# Patient Record
Sex: Female | Born: 2000
Health system: Southern US, Community
[De-identification: ages and names within clinical notes are randomized; demographics above are authoritative.]

## PROBLEM LIST (undated history)

## (undated) DIAGNOSIS — L709 Acne, unspecified: Secondary | ICD-10-CM

## (undated) DIAGNOSIS — J3089 Other allergic rhinitis: Secondary | ICD-10-CM

## (undated) HISTORY — DX: Acne, unspecified: L70.9

## (undated) HISTORY — DX: Other allergic rhinitis: J30.89

---
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
## 2022-06-27 ENCOUNTER — Ambulatory Visit: Payer: No Typology Code available for payment source | Admitting: Neurology

## 2022-08-20 ENCOUNTER — Ambulatory Visit: Payer: No Typology Code available for payment source | Admitting: Neurology

## 2022-11-15 IMAGING — DX DG RIBS W/ CHEST 3+V*R*
3 series · 3 of 3 positions shown · non-contrast
Comparison: None.

CLINICAL DATA: Lower chest pain after cough and bending over

EXAM:
RIGHT RIBS AND CHEST - 3+ VIEW

[chest pa]
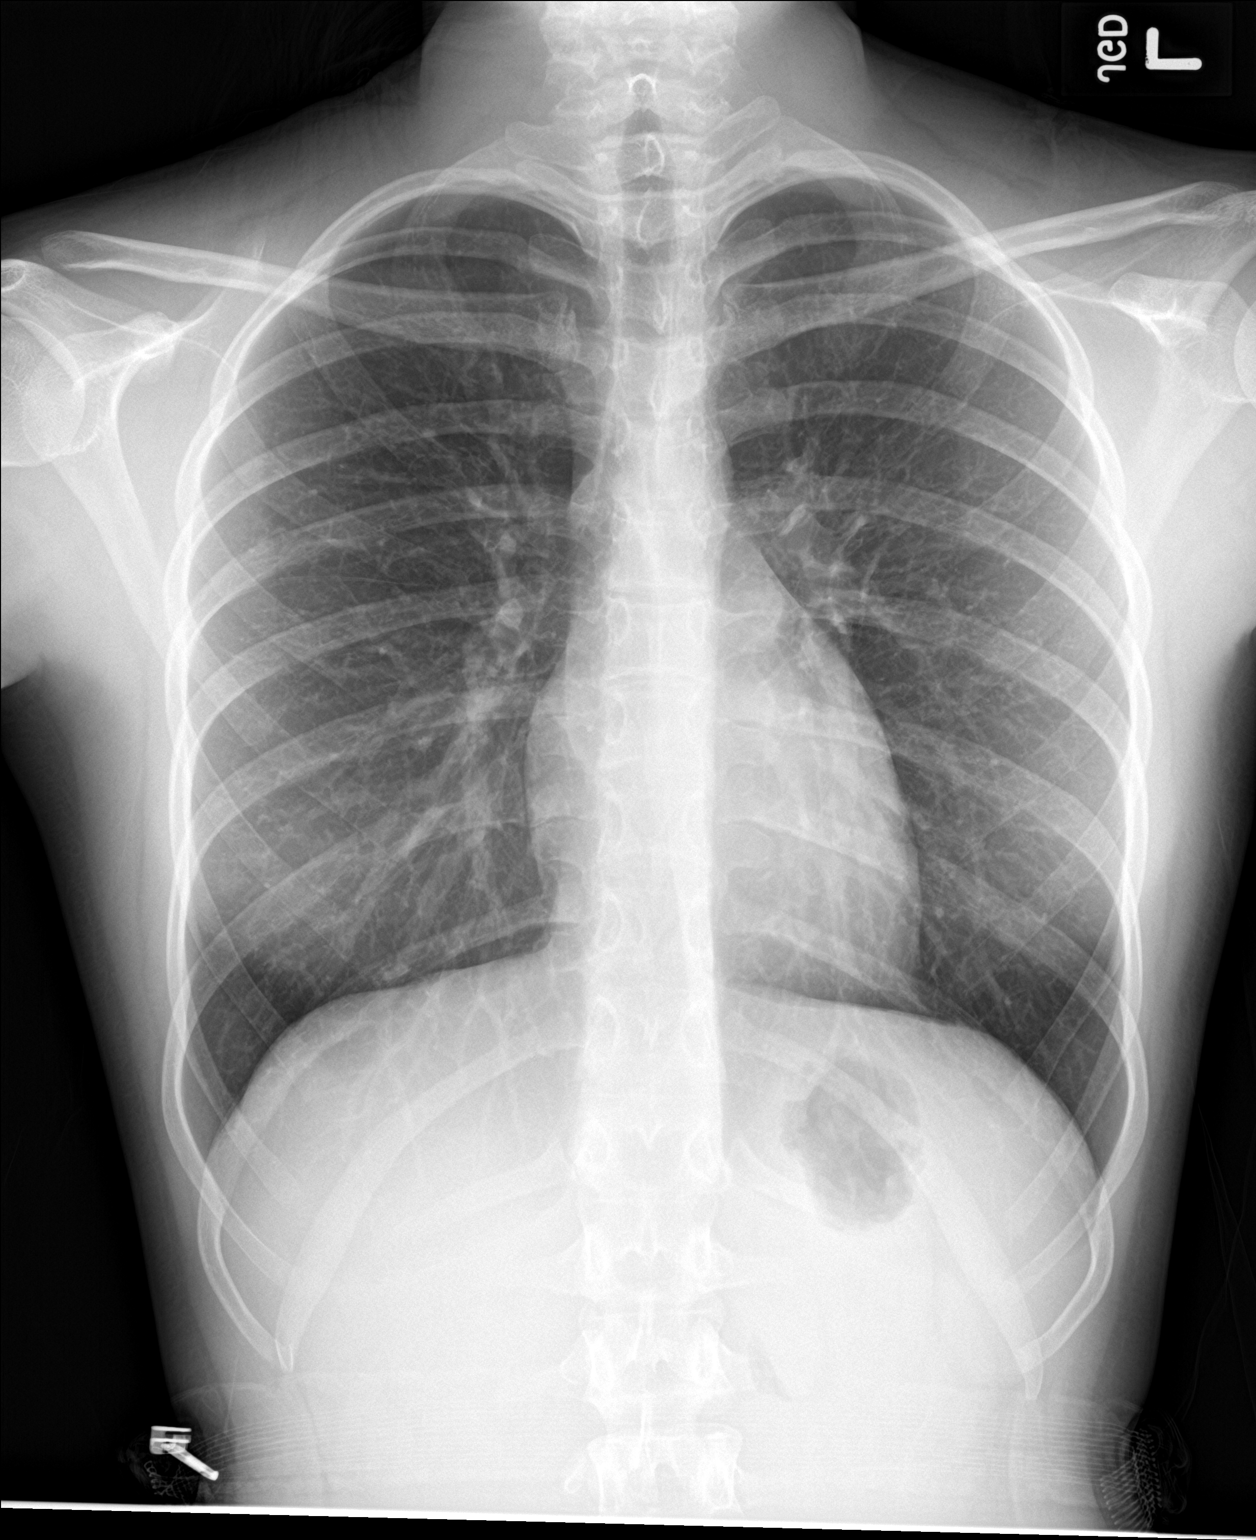

[rib obl]
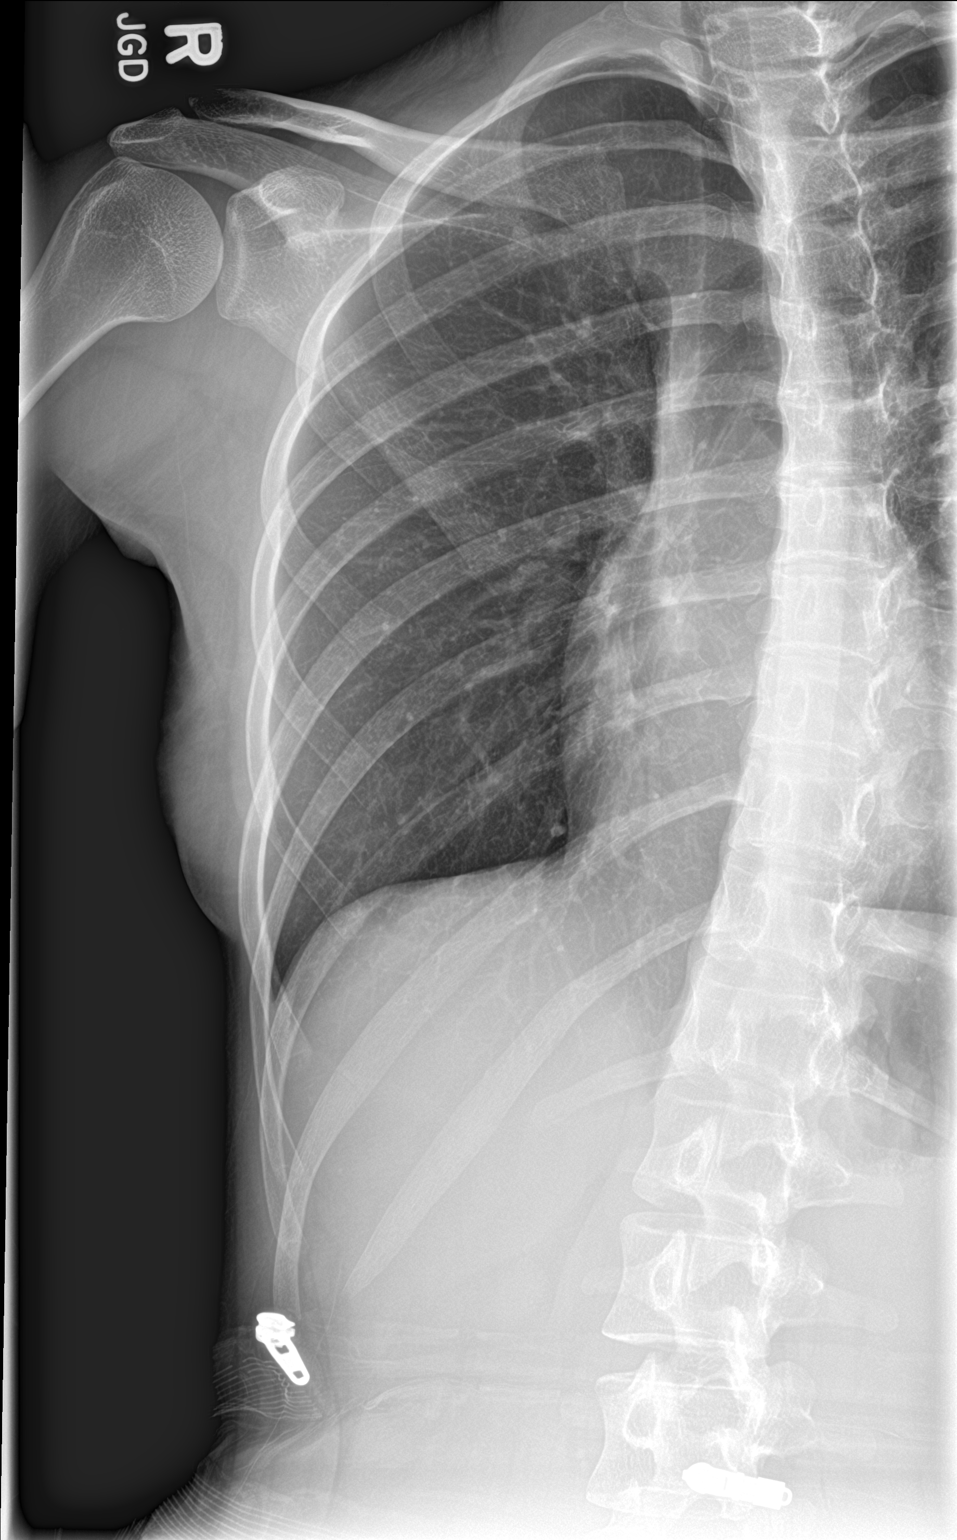

[rib ap]
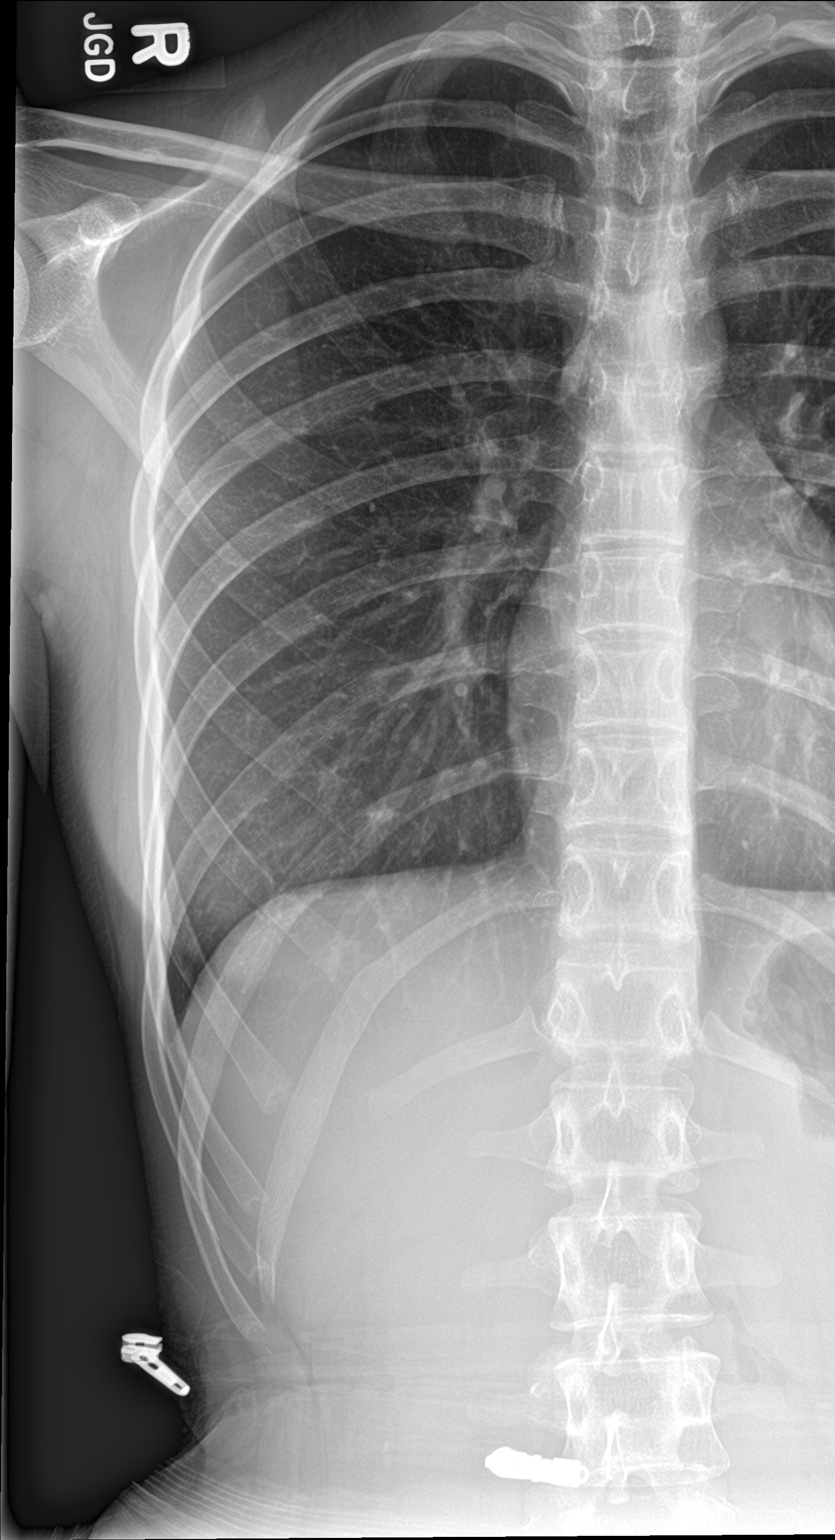

[3 of 3 positions shown; findings below may reference images not displayed]

FINDINGS: No fracture or other bone lesions are seen involving the ribs. There
is no evidence of pneumothorax or pleural effusion. Both lungs are
clear. Heart size and mediastinal contours are within normal limits.
IMPRESSION: Negative.
